# Patient Record
Sex: Male | Born: 1990 | Race: Black or African American | Hispanic: No | Marital: Single | Smoking: Current every day smoker
Health system: Southern US, Community
[De-identification: ages and names within clinical notes are randomized; demographics above are authoritative.]

---

## 2015-10-12 ENCOUNTER — Emergency Department (HOSPITAL_COMMUNITY)
Admission: EM | Admit: 2015-10-12 | Discharge: 2015-10-12 | Disposition: A | Discharge status: Home / Self Care | Payer: Self-pay | Attending: Emergency Medicine | Admitting: Emergency Medicine

## 2015-10-12 ENCOUNTER — Encounter (HOSPITAL_COMMUNITY): Payer: Self-pay | Admitting: Emergency Medicine

## 2015-10-12 DIAGNOSIS — F17210 Nicotine dependence, cigarettes, uncomplicated: Secondary | ICD-10-CM | POA: Insufficient documentation

## 2015-10-12 DIAGNOSIS — B9789 Other viral agents as the cause of diseases classified elsewhere: Secondary | ICD-10-CM

## 2015-10-12 DIAGNOSIS — R197 Diarrhea, unspecified: Secondary | ICD-10-CM | POA: Insufficient documentation

## 2015-10-12 DIAGNOSIS — J069 Acute upper respiratory infection, unspecified: Secondary | ICD-10-CM | POA: Insufficient documentation

## 2015-10-12 MED ORDER — BENZONATATE 100 MG PO CAPS: 200.0000 mg | ORAL_CAPSULE | Freq: Two times a day (BID) | ORAL | Status: AC | PRN

## 2015-10-12 MED ORDER — PSEUDOEPHEDRINE HCL 60 MG PO TABS: 60.0000 mg | ORAL_TABLET | Freq: Four times a day (QID) | ORAL | Status: AC | PRN

## 2015-10-12 NOTE — ED Notes (Signed)
Pt reports chest congestion and generalized body aches x 1 week. Pt alert x4. NAD at this time.

## 2015-10-12 NOTE — ED Provider Notes (Signed)
CSN: [id number]     Arrival date & time 10/12/15  1546 History  By signing my name below, I, Gwenyth Ober, attest that this documentation has been prepared under the direction and in the presence of Melburn Hake, [place].  Electronically Signed: Gwenyth Ober, ED Scribe. 10/12/2015. 4:48 PM   Chief Complaint  Patient presents with  . Cough   The history is provided by the patient. No language interpreter was used.   HPI Comments: Dezmin Kittelson is a 24 y.o. male who presents to the Emergency Department complaining of intermittent, moderate productive cough with yellow sputum that started 4 days ago. Pt states intermittent, throbbing, left-sided HA, diaphoresis, sore throat, NBNB emesis x1 and subjective fever as associated symptoms. He also notes a few episodes of diarrhea, which have resolved. He has tried Theraflu with no relief. 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 He denies nasal discharge, changes in vision, sinus pressure, difficulty breathing, congestion, abdominal pain, nausea, vomiting, CP, urinary frequency, dysuria, hematuria and blood in his stool.   History reviewed. No pertinent past medical history. History reviewed. No pertinent past surgical history. No family history on file. Social History  Substance Use Topics  . Smoking status: Current Every Day Smoker -- 0.20 packs/day    Types: Cigarettes  . Smokeless tobacco: None  . Alcohol Use: Yes     Comment: occasional    Review of Systems  Constitutional: Positive for fever and diaphoresis.  HENT: Positive for congestion and sore throat. Negative for rhinorrhea and sinus pressure.   Respiratory: Positive for cough. Negative for shortness of breath.   Cardiovascular: Negative for chest pain.  Gastrointestinal: Positive for diarrhea. Negative for nausea, vomiting, abdominal pain and blood in stool.  Genitourinary: Negative for dysuria, frequency,  hematuria and difficulty urinating.  Neurological: Positive for headaches.    Allergies  Review of patient's allergies indicates no known allergies.  Home Medications   Prior to Admission medications   Medication Sig Start Date End Date Taking? Authorizing Provider  benzonatate (TESSALON) 100 MG capsule Take 2 capsules (200 mg total) by mouth 2 (two) times daily as needed for cough. 10/12/15   Barrett Henle, PA-C  pseudoephedrine (SUDAFED) 60 MG tablet Take 1 tablet (60 mg total) by mouth every 6 (six) hours as needed for congestion. 10/12/15   Satira Sark Nadeau, PA-C   BP 143/93 mmHg  Pulse 99  Temp(Src) 98.3 F (36.8 C) (Oral)  Resp 18  Ht  (1.803 m)  Wt 238 lb 3.2 oz (108.047 kg)  BMI 33.24 kg/m2  SpO2 98% Physical Exam  Constitutional: He is oriented to person, place, and time. He appears well-developed and well-nourished. No distress.  HENT:  Head: Normocephalic and atraumatic.  Right Ear: External ear normal.  Left Ear: External ear normal.  Mouth/Throat: Oropharynx is clear and moist. No oropharyngeal exudate, posterior oropharyngeal edema, posterior oropharyngeal erythema or tonsillar abscesses.  Eyes: Conjunctivae and EOM are normal. Pupils are equal, round, and reactive to light. Right eye exhibits no discharge. Left eye exhibits no discharge. No scleral icterus.  Neck: Normal range of motion. Neck supple. No tracheal deviation present.  Cardiovascular: Normal rate, regular rhythm, normal heart sounds and intact distal pulses.   No murmur heard. Pulmonary/Chest: Effort normal. No respiratory distress. He has no wheezes. He has no rales. He exhibits no tenderness.  Abdominal: Soft. Bowel sounds are normal. He exhibits no distension and no  mass. There is no tenderness. There is no rebound and no guarding.  Musculoskeletal: He exhibits no edema.  Lymphadenopathy:    He has no cervical adenopathy.  Neurological: He is alert and oriented to person,  place, and time.  Skin: Skin is warm and dry.  Psychiatric: He has a normal mood and affect. His behavior is normal.  Nursing note and vitals reviewed.   ED Course  Procedures  DIAGNOSTIC STUDIES: Oxygen Saturation is 98% on RA, normal by my interpretation.    COORDINATION OF CARE: 4:50 PM Discussed suspicion for viral cold symptoms and treatment plan with pt which includes decongestant and Tessalon. Pt agreed to plan.  Labs Review Labs Reviewed - No data to display  Imaging Review No results found.  Filed Vitals:   10/12/15 1614  BP: 143/93  Pulse: 99  Temp: 98.3 F (36.8 C)  Resp: 18     MDM  Pt presents with productive cough x 4 days. He reports symptoms have improved over the past few days. VSS. Exam unremarkable. Suspect viral URI. Will prescribe decongestant and antitussive. Advised pt to follow-up with PCP for worsening or no improvement in symptoms.  Evaluation does not show pathology requring ongoing emergent intervention or admission. Pt is hemodynamically stable and mentating appropriately. Discussed findings/results and plan with patient/guardian, who agrees with plan. All questions answered. Return precautions discussed and outpatient follow up given.    Final diagnoses:  Viral URI with cough   I personally performed the services described in this documentation, which was scribed in my presence. The recorded information has been reviewed and is accurate.     Satira Sarkicole Elizabeth Big Foot PrairieNadeau, [place]-C 10/12/15 1659  Lorre NickAnthony Allen, MD 10/12/15 1710

## 2015-10-12 NOTE — Discharge Instructions (Signed)
Take your medications as prescribed. Please follow up with a primary care provider from the Resource Guide provided below in 4-5 days. Please return to the Emergency Department if symptoms worsen or new onset of fever, chills, difficulty breathing, chest pain, abdominal pain, vomiting, unable to keep fluids down due to nausea/vomiting.   Emergency Department Resource Guide 1) Find a Doctor and Pay Out of Pocket Although you won't have to find out who is covered by your insurance plan, it is a good idea to ask around and get recommendations. You will then need to call the office and see if the doctor you have chosen will accept you as a new patient and what types of options they offer for patients who are self-pay. Some doctors offer discounts or will set up payment plans for their patients who do not have insurance, but you will need to ask so you aren't surprised when you get to your appointment.  2) Contact Your Local Health Department Not all health departments have doctors that can see patients for sick visits, but many do, so it is worth a call to see if yours does. If you don't know where your local health department is, you can check in your phone book. The CDC also has a tool to help you locate your state's health department, and many state websites also have listings of all of their local health departments.  3) Find a Walk-in Clinic If your illness is not likely to be very severe or complicated, you may want to try a walk in clinic. These are popping up all over the country in pharmacies, drugstores, and shopping centers. They're usually staffed by nurse practitioners or physician assistants that have been trained to treat common illnesses and complaints. They're usually fairly quick and inexpensive. However, if you have serious medical issues or chronic medical problems, these are probably not your best option.  No Primary Care Doctor: - Call Health Connect at  [phone] - they can help you  locate a primary care doctor that  accepts your insurance, provides certain services, etc. - Physician Referral Service- [phone]  Chronic Pain Problems: Organization         Address  Phone   Notes  [facility]  [phone] Patients need to be referred by their primary care doctor.   Medication Assistance: Organization         Address  Phone   Notes  [facility] Med[facility] [address] [place], [place] [address] [phone] --Must be a resident of [facility] [place] -- Must have NO insurance coverage whatsoever (no Medicaid/ Medicare, etc.) -- The pt. MUST have a primary care doctor that directs their care regularly and follows them in the community   MedAssist  [phone]   [employer]  [phone]    Agencies that provide inexpensive medical care: Organization         Address  Phone   Notes  [facility]  [phone]   [facility]    [phone]   [facility] [address] [place], [place] [address] [phone]   [facility] [address] [place], [place] [phone]   [facility]    [phone]   [facility]    [phone]   [facility]  [address], [place] Phone:  [phone], Fax:  [phone] Hours of Operation:  9 am - 6 pm, M-F.  Also accepts Medicaid/Medicare and self-pay.  [facility] [place], [address], [place] Phone: [phone], Fax: [phone]. Hours of Operation:  8:30 am - 5:30 pm, M-F.  Also accepts Medicaid and self-pay.  [facility] [place] [address] [place] Phone: [phone]   [place], [place], [place] [phone], Ext. [phone] Mondays & Thursdays: 7-9 AM.  First 15 patients are seen on a first come, first serve basis.    [place]  Providers:  Organization         Address  Phone   Notes  [facility] [address], [place] [phone] Also accepts self-pay patients.  [facility] [address], [place]  [phone]   [place], [address], [place] [phone]   [facility] [address] [place] [phone]   Lucianne Lei [address], [place]   [phone] Only accepts [place] Access [place] patients after they have their name applied to their card.   Self-Pay (no insurance) in [facility]:  Organization         Address  Phone   Notes  Sickle Cell Patients, [facility] [place] [phone]   [facility] [place] [phone]   [facility]  [place], [place],  [phone]   [facility]/Dr. Osei-Bonsu  [address] [place] or [address], [place] [phone] Phone number for both [place] and [place] locations is the same.  [facility] [address], [place] [phone]   [facility] [address] [place] or [address] [phone]   [facility] [address], [place] [phone], phone; [phone], fax Sees patients 1st and 3rd Saturday of every month.  Must not qualify for public or private insurance (i.e. Medicaid, Medicare, [place] Health Choice, Veterans' Benefits)  Household income should be no more than 200% of the poverty level The clinic cannot treat you if you are pregnant or think you are pregnant  Sexually transmitted diseases are not treated at the clinic.    Dental Care: Organization         Address  Phone  Notes  [facility] Department of [facility] [place] [phone] Accepts children up to age 12 who are enrolled in [place] or [place]; pregnant women with a Medicaid card; and children who have applied for Medicaid or [place] Health Choice, but were declined, whose parents can pay a reduced fee at time of service.  [facility] Department of [facility]  [address], [place] [phone] Accepts children up to age 24 who are enrolled in [place] or [place]; pregnant women with a Medicaid card; and children who have applied for Medicaid or [place] Health Choice, but were declined, whose parents can pay a reduced fee at time of service.  [facility] Adult [facility] Access PROGRAM  [place] [phone] Patients are seen by appointment only. Walk-ins are not accepted. [place] will see patients 18 years  of age and older. Monday - Tuesday (8am-5pm) Most Wednesdays (8:30-5pm) $30 per visit, cash only  [facility] Adult Dental Access PRO[facility]  [address], [facility] [phone] Patients are seen by appointment only. Walk-ins are not accepted. [place] will see patients 93 years of age and older. One Wednesday Evening (Monthly: Volunteer Based).  $30 per visit, cash only  [place]  [phone] for adults; Children under age 24, call Graduate Pediatric Dentistry at [phone]. Children aged 6-14, please call [phone] to request a pediatric application.  Dental services are provided in all areas of dental care including fillings, crowns and bridges, complete and partial dentures, implants, gum treatment, root canals, and extractions. Preventive care is also provided. Treatment is provided to both adults and children. Patients are selected via a lottery and there is often a waiting list.   [facility] [address] [place]  [phone] [email or web address]   [facility]  [address] [place], [place] [phone], Ext. [phone] Second and Fourth Thursday of each month, opens at 6:30 AM; Clinic ends at 9 AM.  Patients are seen on a first-come first-served basis, and a limited number are seen during each clinic.   [facility]  [address] [place]ard Danker [place], [place] [phone]   Eligibility Requirements You must have lived in [place], [place], or [place] counties for at least the last three months.   You cannot be eligible for state or federal sponsored [employer], including [employer], [place], or [employer].   You generally cannot be eligible for healthcare insurance through your employer.    How to apply: Eligibility screenings are held every Tuesday and Wednesday afternoon from 1:00 pm until 4:00 pm. You do not need an appointment for the interview!  [facility] [address] [place], [place]   [place]  [place] Department  [place]  [phone]    Behavioral Health Resources in the Community: Intensive Outpatient Programs Organization         Address  Phone  Notes  [place]. [address] [place], [place] [phone]   [facility] Outpatient [address] [place], [place]   ADS: Alcohol & Drug Svcs [address], [place], [place]   [place] [address],  [place], [place] or [phone]   Substance Abuse Resources Organization         Address  Phone  Notes  Alcohol and Drug Services  [phone]   [place]  [phone]   [place]   Chinita Pester  [phone]   Residential & Outpatient Substance Abuse Program  [phone]   Psychological Services Organization         Address  Phone  Notes  [facility] [place]  [place]  [phone]   [place] [address], [place] or [phone]    Mobile Crisis Teams Organization         Address  Phone  Notes  [facility], Mobile Crisis Care Unit  [phone]   [facility] Psychotherapeutic [facility]  [address] [place], [place]   [facility] [address] [place] [phone]    Self-Help/Support Groups Organization  Address  Phone             Notes  [place] of [place] - variety of support groups  [place] Call for more information  Narcotics Anonymous (NA), [facility] [address], [employer] [place]  2 meetings at this location   Special educational needs teacher         Address  Phone  Notes  ASAP Residential Treatment [place],    [place]  [phone]   [facility]  [address], [place] [address]561, [place], [place]   [place], [place] [phone] Admissions: 8am-3pm M-F  Incentives Substance [place] [address]    [place], [place] [phone]   [facility] [address] [place], [place], [place]   [facility] [address],  [place], [place]   [facility] - Intensive Outpatient [address], [place], [place]   [facility] ([facility] [address].,  [place], [place] [phone] or [phone]   [facility]idential Treatment Services (RTS) [address], [place], [place] Accepts Medicaid  [facility] [place] [address],  [place] [phone] Substance Abuse/Addiction Treatment   [facility] Organization         Address  Phone  Notes  [facility]  [phone]   Domenic Schwab, PhD [address] Arlis [place], [place]   [phone] or [phone]    [place], [place] [phone]   [facility] [address], [place], [place] [phone] Insurance/Medicaid/sponsorship through [facility] and Families [address], [address]                                    [place], [place] [phone] [place] [address], [place] [phone]    Dr. Adele Schilder  [phone]   [facility] [place] Dept. 1) [address], [place] 2) [place] 3)  [place] [address], [place] [phone]  [phone]   [place] [phone] or [phone] (After Hours)

## 2018-06-26 ENCOUNTER — Other Ambulatory Visit: Payer: Self-pay

## 2018-06-26 ENCOUNTER — Emergency Department (HOSPITAL_COMMUNITY): Payer: Self-pay

## 2018-06-26 ENCOUNTER — Emergency Department (HOSPITAL_COMMUNITY)
Admission: EM | Admit: 2018-06-26 | Discharge: 2018-06-26 | Disposition: A | Discharge status: Home / Self Care | Payer: Self-pay | Attending: Emergency Medicine | Admitting: Emergency Medicine

## 2018-06-26 DIAGNOSIS — J9801 Acute bronchospasm: Secondary | ICD-10-CM | POA: Insufficient documentation

## 2018-06-26 DIAGNOSIS — F17210 Nicotine dependence, cigarettes, uncomplicated: Secondary | ICD-10-CM | POA: Insufficient documentation

## 2018-06-26 DIAGNOSIS — J209 Acute bronchitis, unspecified: Secondary | ICD-10-CM

## 2018-06-26 DIAGNOSIS — J40 Bronchitis, not specified as acute or chronic: Secondary | ICD-10-CM | POA: Insufficient documentation

## 2018-06-26 MED ORDER — ALBUTEROL SULFATE HFA 108 (90 BASE) MCG/ACT IN AERS
1.0000 | INHALATION_SPRAY | RESPIRATORY_TRACT | Status: DC | PRN
  Administered 2018-06-26: 1 via RESPIRATORY_TRACT
  Filled 2018-06-26: qty 6.7

## 2018-06-26 MED ORDER — PREDNISONE 20 MG PO TABS: ORAL_TABLET | ORAL | 0 refills | Status: AC

## 2018-06-26 NOTE — ED Provider Notes (Signed)
[facility] EMERGENCY DEPARTMENT Provider Note   CSN: [id number] Arrival date & time: 06/26/18  0645     History   Chief Complaint Chief Complaint  Patient presents with  . Shoulder Pain    HPI Kylyn Sookram is a 27 y.o. male.  HPI Patient complains of left-sided shoulder, chest and abdominal pain that started 2 days ago after lifting a microwave.  States he believes he strained his left side.  He denies shortness of breath.  Describes the pain as spasming in nature.  No nausea, vomiting or diarrhea.  No fever or chills.  No focal weakness or numbness.  Patient denies any neck pain. No past medical history on file.  There are no active problems to display for this patient.   No past surgical history on file.      Home Medications    Prior to Admission medications   Medication Sig Start Date End Date Taking? Authorizing Provider  benzonatate (TESSALON) 100 MG capsule Take 2 capsules (200 mg total) by mouth 2 (two) times daily as needed for cough. Patient not taking: Reported on 06/26/2018 10/12/15   Barrett Henle, PA-C  predniSONE (DELTASONE) 20 MG tablet 3 tabs po day one, then 2 po daily x 4 days 06/26/18   Loren Racer, MD  pseudoephedrine (SUDAFED) 60 MG tablet Take 1 tablet (60 mg total) by mouth every 6 (six) hours as needed for congestion. Patient not taking: Reported on 06/26/2018 10/12/15   Barrett Henle, PA-C    Family History No family history on file.  Social History Social History   Tobacco Use  . Smoking status: Current Every Day Smoker    Packs/day: 0.20    Types: Cigarettes  Substance Use Topics  . Alcohol use: Yes    Comment: occasional  . Drug use: No     Allergies   Patient has no known allergies.   Review of Systems Review of Systems  Constitutional: Negative for chills and fever.  HENT: Negative for sore throat and trouble swallowing.   Respiratory: Negative for cough and shortness of breath.    Cardiovascular: Positive for chest pain. Negative for palpitations and leg swelling.  Gastrointestinal: Positive for abdominal pain. Negative for constipation, diarrhea, nausea and vomiting.  Genitourinary: Negative for dysuria and flank pain.  Musculoskeletal: Positive for back pain and myalgias. Negative for neck pain and neck stiffness.  Skin: Negative for rash and wound.  Neurological: Negative for dizziness, weakness, light-headedness, numbness and headaches.  All other systems reviewed and are negative.    Physical Exam Updated Vital Signs BP 126/73   Pulse 87   Temp 97.9 F (36.6 C) (Oral)   Resp 20   Ht 5\' 11"  (1.803 m)   Wt 113.4 kg (250 lb)   SpO2 99%   BMI 34.87 kg/m   Physical Exam  Constitutional: He is oriented to person, place, and time. He appears well-developed and well-nourished. No distress.  HENT:  Head: Normocephalic and atraumatic.  Mouth/Throat: Oropharynx is clear and moist. No oropharyngeal exudate.  Eyes: Pupils are equal, round, and reactive to light. EOM are normal.  Neck: Normal range of motion. Neck supple. No JVD present.  No posterior midline cervical tenderness to palpation.  Cardiovascular: Normal rate and regular rhythm. Exam reveals no gallop and no friction rub.  No murmur heard. Pulmonary/Chest: Effort normal. No stridor. No respiratory distress. He has no wheezes. He has no rales. He exhibits no tenderness.  Diminished breath sounds especially in  bilateral bases.  Abdominal: Soft. Bowel sounds are normal. There is no tenderness. There is no rebound and no guarding.  Musculoskeletal: Normal range of motion. He exhibits no edema or tenderness.  Full range of motion of the left shoulder without discomfort, swelling, deformity, erythema or warmth.  No thoracic or chest wall tenderness to palpation.  No crepitance or deformity.  No midline thoracic or lumbar tenderness.  No lower extremity swelling, asymmetry or tenderness.  Distal pulses are  2+.  Lymphadenopathy:    He has no cervical adenopathy.  Neurological: He is alert and oriented to person, place, and time.  5/5 motor in all extremities.  Sensation fully intact.  Skin: Skin is warm and dry. No rash noted. He is not diaphoretic. No erythema.  Psychiatric: He has a normal mood and affect. His behavior is normal.  Nursing note and vitals reviewed.    ED Treatments / Results  Labs (all labs ordered are listed, but only abnormal results are displayed) Labs Reviewed - No data to display  EKG EKG Interpretation  Date/Time:  Thursday June 26 2018 08:14:24 EDT Ventricular Rate:  91 PR Interval:    QRS Duration: 95 QT Interval:  366 QTC Calculation: 451 R Axis:   78 Text Interpretation:  Sinus rhythm Confirmed by Loren RacerYelverton, Akeira Lahm ([id number]) on 06/26/2018 10:16:31 AM   Radiology Dg Chest 2 View  Result Date: 06/26/2018 CLINICAL DATA:  Left chest pain and shortness of breath the past 2 days. Left rib pain and spasms. No known injury. Smoker. EXAM: CHEST - 2 VIEW COMPARISON:  None. FINDINGS: Poor inspiration. Grossly normal sized heart. Clear lungs. Mild diffuse peribronchial thickening. Unremarkable bones. IMPRESSION: Mild bronchitic changes. Electronically Signed   By: Beckie SaltsSteven  Reid M.D.   On: 06/26/2018 08:29    Procedures Procedures (including critical care time)  Medications Ordered in ED Medications  albuterol (PROVENTIL HFA;VENTOLIN HFA) 108 (90 Base) MCG/ACT inhaler 1-2 puff (1 puff Inhalation Given 06/26/18 1011)     Initial Impression / Assessment and Plan / ED Course  I have reviewed the triage vital signs and the nursing notes.  Pertinent labs & imaging results that were available during my care of the patient were reviewed by me and considered in my medical decision making (see chart for details).     Patient is resting comfortably.  He is in no apparent discomfort.  Unable to reproduce pain with palpation.  Patient describes the pain as episodic and  spasming in nature.  Low suspicion for cardiac or pulmonary cause for the patient's symptoms.  Will screen with EKG and chest x-ray.  Chest x-ray with evidence of bronchitis.  Patient admits to fever and cough last week which is improved.  States he has had occasional wheezes.  Suspect bronchospasm related to recent viral illness.  Given inhaler and will give short course of steroids.  EKG without concerning findings.  Strict return precautions have been given.  Final Clinical Impressions(s) / ED Diagnoses   Final diagnoses:  Bronchitis with bronchospasm    ED Discharge Orders        Ordered    predniSONE (DELTASONE) 20 MG tablet     06/26/18 1014       Loren RacerYelverton, Katelind Pytel, MD 06/26/18 1017

## 2018-06-26 NOTE — ED Triage Notes (Signed)
Patient states that he was moving a microwave and hurt his left shoulder and his "whole left side". ROM is WNL.

## 2018-06-26 NOTE — ED Notes (Signed)
Patient transported to X-ray 

## 2019-02-18 ENCOUNTER — Other Ambulatory Visit: Payer: Self-pay

## 2019-02-18 ENCOUNTER — Emergency Department (HOSPITAL_COMMUNITY)
Admission: EM | Admit: 2019-02-18 | Discharge: 2019-02-18 | Disposition: A | Discharge status: Home / Self Care | Payer: Self-pay | Attending: Emergency Medicine | Admitting: Emergency Medicine

## 2019-02-18 ENCOUNTER — Encounter (HOSPITAL_COMMUNITY): Payer: Self-pay | Admitting: Emergency Medicine

## 2019-02-18 DIAGNOSIS — Z7689 Persons encountering health services in other specified circumstances: Secondary | ICD-10-CM | POA: Insufficient documentation

## 2019-02-18 DIAGNOSIS — F17210 Nicotine dependence, cigarettes, uncomplicated: Secondary | ICD-10-CM | POA: Insufficient documentation

## 2019-02-18 NOTE — ED Triage Notes (Signed)
Pt. Stated, I need a work note cause I was sick 2 days.

## 2019-02-18 NOTE — Discharge Instructions (Addendum)
As discussed today you have no symptoms, please inform your workplace will we have discussed today on using the emergency room for work notes.

## 2019-02-18 NOTE — ED Provider Notes (Signed)
[facility] EMERGENCY DEPARTMENT Provider Note   CSN: [id number] Arrival date & time: 02/18/19  9476    History   Chief Complaint Chief Complaint  Patient presents with  . Letter for School/Work    HPI Cainan Esteves is a 28 y.o. male.     HPI   28 year old male presents today with request for work note.  He notes he was at work on Monday with small amount of diarrhea.  Patient notes he is asymptomatic has no fever and did not have fever.  He has no respiratory symptoms.  He is simply requesting to go back to work.  He is employer requested that he come get a work note for return.  History reviewed. No pertinent past medical history.  There are no active problems to display for this patient.   History reviewed. No pertinent surgical history.     Home Medications    Prior to Admission medications   Medication Sig Start Date End Date Taking? Authorizing Provider  benzonatate (TESSALON) 100 MG capsule Take 2 capsules (200 mg total) by mouth 2 (two) times daily as needed for cough. Patient not taking: Reported on 06/26/2018 10/12/15   Barrett Henle, PA-C  predniSONE (DELTASONE) 20 MG tablet 3 tabs po day one, then 2 po daily x 4 days 06/26/18   Loren Racer, MD  pseudoephedrine (SUDAFED) 60 MG tablet Take 1 tablet (60 mg total) by mouth every 6 (six) hours as needed for congestion. Patient not taking: Reported on 06/26/2018 10/12/15   Barrett Henle, PA-C    Family History No family history on file.  Social History Social History   Tobacco Use  . Smoking status: Current Every Day Smoker    Packs/day: 0.20    Types: Cigarettes  . Smokeless tobacco: Current User  Substance Use Topics  . Alcohol use: Yes    Comment: occasional  . Drug use: No     Allergies   Patient has no known allergies.   Review of Systems Review of Systems  All other systems reviewed and are negative.  Physical Exam Updated Vital Signs BP (!)  128/97 (BP Location: Right Arm)   Pulse 82   Temp 98.1 F (36.7 C) (Oral)   Resp 18   Ht 5\' 11"  (1.803 m)   Wt 117.9 kg   SpO2 99%   BMI 36.26 kg/m   Physical Exam Vitals signs and nursing note reviewed.  Constitutional:      Appearance: He is well-developed.  HENT:     Head: Normocephalic and atraumatic.  Eyes:     General: No scleral icterus.       Right eye: No discharge.        Left eye: No discharge.     Conjunctiva/sclera: Conjunctivae normal.     Pupils: Pupils are equal, round, and reactive to light.  Neck:     Musculoskeletal: Normal range of motion.     Vascular: No JVD.     Trachea: No tracheal deviation.  Pulmonary:     Effort: Pulmonary effort is normal.     Breath sounds: No stridor.  Neurological:     Mental Status: He is alert and oriented to person, place, and time.     Coordination: Coordination normal.  Psychiatric:        Behavior: Behavior normal.        Thought Content: Thought content normal.        Judgment: Judgment normal.  ED Treatments / Results  Labs (all labs ordered are listed, but only abnormal results are displayed) Labs Reviewed - No data to display  EKG None  Radiology No results found.  Procedures Procedures (including critical care time)  Medications Ordered in ED Medications - No data to display   Initial Impression / Assessment and Plan / ED Course  I have reviewed the triage vital signs and the nursing notes.  Pertinent labs & imaging results that were available during my care of the patient were reviewed by me and considered in my medical decision making (see chart for details).        Labs:   Imaging:  Consults:  Therapeutics:  Discharge Meds:   Assessment/Plan: Asymptomatic individual here for a work note.  He did have diarrhea earlier in the week, none presently, no signs of illness presently.      Final Clinical Impressions(s) / ED Diagnoses   Final diagnoses:  Return to work  evaluation    ED Discharge Orders    None       Rosalio Loud 02/18/19 1423    Azalia Bilis, MD 02/20/19 2040

## 2019-11-24 ENCOUNTER — Ambulatory Visit: Payer: No Typology Code available for payment source | Attending: Internal Medicine

## 2019-11-24 DIAGNOSIS — R238 Other skin changes: Secondary | ICD-10-CM | POA: Insufficient documentation

## 2019-11-24 DIAGNOSIS — U071 COVID-19: Secondary | ICD-10-CM | POA: Insufficient documentation

## 2019-11-25 LAB — NOVEL CORONAVIRUS, NAA: SARS-CoV-2, NAA: NOT DETECTED

## 2019-12-30 ENCOUNTER — Emergency Department (HOSPITAL_COMMUNITY)
Admission: EM | Admit: 2019-12-30 | Discharge: 2019-12-30 | Disposition: A | Discharge status: Home / Self Care | Payer: No Typology Code available for payment source | Attending: Emergency Medicine | Admitting: Emergency Medicine

## 2019-12-30 ENCOUNTER — Emergency Department (HOSPITAL_COMMUNITY): Payer: No Typology Code available for payment source

## 2019-12-30 ENCOUNTER — Encounter (HOSPITAL_COMMUNITY): Payer: Self-pay | Admitting: Emergency Medicine

## 2019-12-30 ENCOUNTER — Other Ambulatory Visit: Payer: Self-pay

## 2019-12-30 DIAGNOSIS — Y999 Unspecified external cause status: Secondary | ICD-10-CM | POA: Insufficient documentation

## 2019-12-30 DIAGNOSIS — F17210 Nicotine dependence, cigarettes, uncomplicated: Secondary | ICD-10-CM | POA: Diagnosis not present

## 2019-12-30 DIAGNOSIS — M549 Dorsalgia, unspecified: Secondary | ICD-10-CM | POA: Diagnosis not present

## 2019-12-30 DIAGNOSIS — R519 Headache, unspecified: Secondary | ICD-10-CM | POA: Diagnosis present

## 2019-12-30 DIAGNOSIS — Y93I9 Activity, other involving external motion: Secondary | ICD-10-CM | POA: Diagnosis not present

## 2019-12-30 DIAGNOSIS — M542 Cervicalgia: Secondary | ICD-10-CM | POA: Diagnosis not present

## 2019-12-30 DIAGNOSIS — Y92410 Unspecified street and highway as the place of occurrence of the external cause: Secondary | ICD-10-CM | POA: Insufficient documentation

## 2019-12-30 MED ORDER — METHOCARBAMOL 500 MG PO TABS: 500.0000 mg | ORAL_TABLET | Freq: Three times a day (TID) | ORAL | 0 refills | Status: AC | PRN
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
## 2019-12-30 NOTE — Discharge Instructions (Addendum)
Please take Ibuprofen (Advil, motrin) and Tylenol (acetaminophen) to relieve your pain.  You may take up to 600 MG (3 pills) of normal strength ibuprofen every 8 hours as needed.  In between doses of ibuprofen you make take tylenol, up to 1,000 mg (two extra strength pills).  Do not take more than 3,000 mg tylenol in a 24 hour period.  Please check all medication labels as many medications such as pain and cold medications may contain tylenol.  Do not drink alcohol while taking these medications.  Do not take other NSAID'S while taking ibuprofen (such as aleve or naproxen).  Please take ibuprofen with food to decrease stomach upset. ° °Today you received medications that may make you sleepy or impair your ability to make decisions.  For the next 24 hours please do not drive, operate heavy machinery, care for a small child with out another adult present, or perform any activities that may cause harm to you or someone else if you were to fall asleep or be impaired.  ° °You are being prescribed a medication which may make you sleepy. Please follow up of listed precautions for at least 24 hours after taking one dose. ° °The best way to get rid of muscle pain is by taking NSAIDS, using heat, massage therapy, and gentle stretching/range of motion exercises. ° °
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
## 2020-02-10 ENCOUNTER — Ambulatory Visit: Payer: Self-pay | Attending: Internal Medicine

## 2020-02-10 ENCOUNTER — Other Ambulatory Visit: Payer: Self-pay

## 2020-02-10 DIAGNOSIS — Z20822 Contact with and (suspected) exposure to covid-19: Secondary | ICD-10-CM

## 2020-02-11 LAB — NOVEL CORONAVIRUS, NAA: SARS-CoV-2, NAA: DETECTED — AB

## 2020-02-12 ENCOUNTER — Telehealth: Payer: Self-pay | Admitting: Adult Health

## 2020-02-12 NOTE — Telephone Encounter (Signed)
Called and reviewed with patient that he is in high risk category for developing complications, or requiring hospitalization for COVID19.  I reviewed with him that he is eligible based on his BMI of 37 to receive treatment with a monoclonal antibody at [facility] to reduce this risk.  Patient declines.  Lillard Anes, NP

## 2020-02-26 ENCOUNTER — Ambulatory Visit: Payer: Self-pay | Attending: Internal Medicine

## 2020-02-26 DIAGNOSIS — Z20822 Contact with and (suspected) exposure to covid-19: Secondary | ICD-10-CM

## 2020-02-27 LAB — NOVEL CORONAVIRUS, NAA: SARS-CoV-2, NAA: NOT DETECTED

## 2020-02-27 LAB — SARS-COV-2, NAA 2 DAY TAT
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
## 2020-11-22 ENCOUNTER — Other Ambulatory Visit: Payer: Self-pay
